# Patient Record
Sex: Female | Born: 1950 | Hispanic: Yes | Smoking: Former smoker
Health system: Southern US, Community
[De-identification: ages and names within clinical notes are randomized; demographics above are authoritative.]

## PROBLEM LIST (undated history)

## (undated) DIAGNOSIS — M1990 Unspecified osteoarthritis, unspecified site: Secondary | ICD-10-CM

## (undated) DIAGNOSIS — M259 Joint disorder, unspecified: Secondary | ICD-10-CM

## (undated) DIAGNOSIS — E785 Hyperlipidemia, unspecified: Secondary | ICD-10-CM

## (undated) DIAGNOSIS — R55 Syncope and collapse: Secondary | ICD-10-CM

## (undated) DIAGNOSIS — R918 Other nonspecific abnormal finding of lung field: Secondary | ICD-10-CM

## (undated) DIAGNOSIS — R42 Dizziness and giddiness: Secondary | ICD-10-CM

## (undated) DIAGNOSIS — I10 Essential (primary) hypertension: Secondary | ICD-10-CM

## (undated) HISTORY — DX: Syncope and collapse: R55

## (undated) HISTORY — PX: KNEE SURGERY: SHX244

## (undated) HISTORY — PX: BUNIONECTOMY: SHX129

## (undated) HISTORY — PX: CARPAL TUNNEL RELEASE: SHX101

## (undated) HISTORY — DX: Joint disorder, unspecified: M25.9

## (undated) HISTORY — PX: BREAST EXCISIONAL BIOPSY: SUR124

## (undated) HISTORY — PX: HEMORRHOID SURGERY: SHX153

## (undated) HISTORY — PX: BREAST SURGERY: SHX581

## (undated) HISTORY — DX: Other nonspecific abnormal finding of lung field: R91.8

## (undated) HISTORY — DX: Dizziness and giddiness: R42

---

## 2016-07-30 ENCOUNTER — Other Ambulatory Visit: Payer: Self-pay | Admitting: Family

## 2016-07-30 DIAGNOSIS — Z122 Encounter for screening for malignant neoplasm of respiratory organs: Secondary | ICD-10-CM

## 2016-08-02 ENCOUNTER — Other Ambulatory Visit: Payer: Self-pay | Admitting: Family

## 2016-08-02 ENCOUNTER — Ambulatory Visit: Payer: Self-pay

## 2016-08-02 ENCOUNTER — Ambulatory Visit
Admission: RE | Admit: 2016-08-02 | Discharge: 2016-08-02 | Disposition: A | Discharge status: Home / Self Care | Payer: Self-pay | Source: Ambulatory Visit | Attending: Family | Admitting: Family

## 2016-08-02 DIAGNOSIS — Z122 Encounter for screening for malignant neoplasm of respiratory organs: Secondary | ICD-10-CM

## 2016-08-02 DIAGNOSIS — Z87891 Personal history of nicotine dependence: Secondary | ICD-10-CM

## 2016-11-05 ENCOUNTER — Other Ambulatory Visit: Payer: Self-pay | Admitting: Family

## 2016-11-05 DIAGNOSIS — R918 Other nonspecific abnormal finding of lung field: Secondary | ICD-10-CM

## 2017-01-11 ENCOUNTER — Ambulatory Visit
Admission: RE | Admit: 2017-01-11 | Discharge: 2017-01-11 | Disposition: A | Discharge status: Home / Self Care | Payer: Medicare Other | Source: Ambulatory Visit | Attending: Family | Admitting: Family

## 2017-01-11 DIAGNOSIS — R918 Other nonspecific abnormal finding of lung field: Secondary | ICD-10-CM

## 2017-10-29 ENCOUNTER — Encounter (HOSPITAL_COMMUNITY): Payer: Self-pay | Admitting: Internal Medicine

## 2017-10-29 ENCOUNTER — Emergency Department (HOSPITAL_COMMUNITY): Payer: Medicare Other

## 2017-10-29 ENCOUNTER — Emergency Department (HOSPITAL_COMMUNITY)
Admission: EM | Admit: 2017-10-29 | Discharge: 2017-10-29 | Disposition: A | Discharge status: Home / Self Care | Payer: Medicare Other | Attending: Emergency Medicine | Admitting: Emergency Medicine

## 2017-10-29 DIAGNOSIS — Y990 Civilian activity done for income or pay: Secondary | ICD-10-CM | POA: Insufficient documentation

## 2017-10-29 DIAGNOSIS — I629 Nontraumatic intracranial hemorrhage, unspecified: Secondary | ICD-10-CM

## 2017-10-29 DIAGNOSIS — I10 Essential (primary) hypertension: Secondary | ICD-10-CM | POA: Diagnosis not present

## 2017-10-29 DIAGNOSIS — S06360A Traumatic hemorrhage of cerebrum, unspecified, without loss of consciousness, initial encounter: Secondary | ICD-10-CM | POA: Diagnosis not present

## 2017-10-29 DIAGNOSIS — E860 Dehydration: Secondary | ICD-10-CM

## 2017-10-29 DIAGNOSIS — W010XXA Fall on same level from slipping, tripping and stumbling without subsequent striking against object, initial encounter: Secondary | ICD-10-CM | POA: Insufficient documentation

## 2017-10-29 DIAGNOSIS — R55 Syncope and collapse: Secondary | ICD-10-CM | POA: Diagnosis not present

## 2017-10-29 DIAGNOSIS — Y92512 Supermarket, store or market as the place of occurrence of the external cause: Secondary | ICD-10-CM | POA: Insufficient documentation

## 2017-10-29 DIAGNOSIS — Y9389 Activity, other specified: Secondary | ICD-10-CM | POA: Insufficient documentation

## 2017-10-29 DIAGNOSIS — S0990XA Unspecified injury of head, initial encounter: Secondary | ICD-10-CM

## 2017-10-29 HISTORY — DX: Essential (primary) hypertension: I10

## 2017-10-29 HISTORY — DX: Hyperlipidemia, unspecified: E78.5

## 2017-10-29 HISTORY — DX: Unspecified osteoarthritis, unspecified site: M19.90

## 2017-10-29 LAB — BASIC METABOLIC PANEL
Anion gap: 11 (ref 5–15)
BUN: 15 mg/dL (ref 8–23)
CHLORIDE: 104 mmol/L (ref 98–111)
CO2: 22 mmol/L (ref 22–32)
CREATININE: 1.15 mg/dL — AB (ref 0.44–1.00)
Calcium: 9.2 mg/dL (ref 8.9–10.3)
GFR calc Af Amer: 55 mL/min — ABNORMAL LOW (ref >60)
GFR, EST NON AFRICAN AMERICAN: 48 mL/min — AB (ref >60)
GLUCOSE: 107 mg/dL — AB (ref 70–99)
Potassium: 3.5 mmol/L (ref 3.5–5.1)
SODIUM: 137 mmol/L (ref 135–145)

## 2017-10-29 LAB — CBC WITH DIFFERENTIAL/PLATELET
Abs Immature Granulocytes: 0 10*3/uL (ref 0.0–0.1)
Basophils Absolute: 0 10*3/uL (ref 0.0–0.1)
Basophils Relative: 0 %
EOS ABS: 0.1 10*3/uL (ref 0.0–0.7)
EOS PCT: 1 %
HEMATOCRIT: 38.8 % (ref 36.0–46.0)
Hemoglobin: 12.9 g/dL (ref 12.0–15.0)
Immature Granulocytes: 0 %
LYMPHS ABS: 1.4 10*3/uL (ref 0.7–4.0)
Lymphocytes Relative: 12 %
MCH: 30.1 pg (ref 26.0–34.0)
MCHC: 33.2 g/dL (ref 30.0–36.0)
MCV: 90.7 fL (ref 78.0–100.0)
MONO ABS: 0.8 10*3/uL (ref 0.1–1.0)
Monocytes Relative: 7 %
Neutro Abs: 9.3 10*3/uL — ABNORMAL HIGH (ref 1.7–7.7)
Neutrophils Relative %: 80 %
Platelets: 227 10*3/uL (ref 150–400)
RBC: 4.28 MIL/uL (ref 3.87–5.11)
RDW: 12.8 % (ref 11.5–15.5)
WBC: 11.6 10*3/uL — AB (ref 4.0–10.5)

## 2017-10-29 LAB — I-STAT TROPONIN, ED: Troponin i, poc: 0 ng/mL (ref 0.00–0.08)

## 2017-10-29 MED ORDER — SODIUM CHLORIDE 0.9 % IV BOLUS
1000.0000 mL | Freq: Once | INTRAVENOUS | Status: AC
  Administered 2017-10-29: 1000 mL via INTRAVENOUS

## 2017-10-29 NOTE — ED Notes (Signed)
Patient transported to CT 

## 2017-10-29 NOTE — Discharge Instructions (Signed)
As we discussed, your head CT today showed a small area of bleeding.  This area is very small and minimal.  He can be safely discharged home.  Please do not take any of your baby aspirin.  You will need to follow-up with referred neurosurgery office for evaluation on an outpatient basis.  Please call their office to arrange for an appointment for reevaluation.  Make sure you are staying hydrated and drinking plenty of fluids.  Follow-up with your primary care doctor as directed.  Return the emergency department for any vision changes, chest pain, difficulty breathing, numbness/weakness of your arms or legs, difficulty walking, vomiting or any other worsening or concerning symptoms.

## 2017-10-29 NOTE — ED Triage Notes (Addendum)
Pt arrived to ED via [facility] after fall and syncopal episode this afternoon. Pt was at work when she became dizzy, then passed out and fell to the ground. 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 Denies head, neck, and back pain. Small abrasion to upper lip noted. Pt states "it all happened so fast. I woke up and I was on the ground."  Vital signs stable. Call bell within reach. Bed locked in lowest position.

## 2017-10-29 NOTE — ED Provider Notes (Signed)
[facility] Provider Note   CSN: [id number] Arrival date & time: 10/29/17  1600     History   Chief Complaint Chief Complaint  Patient presents with  . Loss of Consciousness  . Fall    HPI Belinda Miles is a 67 y.o. female past medical history of arthritis, hyperlipidemia, hypertension who presents via EMS for evaluation of syncopal episode that occurred just prior to ED arrival.  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  She did have positive LOC.  Patient reports that she did not have any preceding chest pain.  Patient states that the next thing she remembers is waking up on the floor with people around her.  Patient reports that the only thing she ate this morning was coffee.  She had been working outside at the garden center for 4 hours prior to onset of syncopal episode.  Patient states she feels a little lightheaded now but otherwise denies any symptoms.  Patient denies any fevers, vision changes, chest pain, difficulty breathing, abdominal pain, numbness/weakness of her arms or legs, nausea/vomiting, neck pain, back pain.   The history is provided by the patient.    Past Medical History:  Diagnosis Date  . Arthritis   . Hyperlipidemia   . Hypertension     There are no active problems to display for this patient.    The histories are not reviewed yet. Please review them in the "History" navigator section and refresh this SmartLink.   OB History   None      Home Medications    Prior to Admission medications   Not on File    Family History No family history on file.  Social History Social History   Tobacco Use  . Smoking status: Not on file  Substance Use Topics  . Alcohol use: Not on file  . Drug use:  Not on file     Allergies   Patient has no allergy information on record.   Review of Systems Review of Systems  Constitutional: Negative for fever.  Respiratory: Negative for cough and shortness of breath.   Cardiovascular: Negative for chest pain.  Gastrointestinal: Negative for abdominal pain, nausea and vomiting.  Genitourinary: Negative for dysuria and hematuria.  Skin: Positive for wound.  Neurological: Positive for syncope and light-headedness. Negative for headaches.  All other systems reviewed and are negative.    Physical Exam Updated Vital Signs BP 123/77   Pulse 74   Temp 98.8 F (37.1 C) (Oral)   Resp 18   Ht 5\' 5"  (1.651 m)   Wt 104.3 kg (230 lb)   SpO2 96%   BMI 38.27 kg/m   Physical Exam  Constitutional: She is oriented to person, place, and time. She appears well-developed and well-nourished.  HENT:  Head: Normocephalic and atraumatic.  Mouth/Throat: Oropharynx is clear and moist and mucous membranes are normal.  Abrasion noted to left upper lip.  No evidence of laceration.  Patient appears intact.  Elevation/depression lateral movement of intermittent mandible intact without any difficulty.  No tenderness palpation noted to mandible, psychotic arch.  Eyes: Pupils are equal, round, and reactive to light. Conjunctivae, EOM and lids are normal.  EOMs intact without any difficulty.  No tenderness palpation noted bilateral  periorbital region.  Neck: Full passive range of motion without pain.  Full flexion/extension and lateral movement of neck fully intact. No bony midline tenderness. No deformities or crepitus.   Cardiovascular: Normal rate, regular rhythm, normal heart sounds and normal pulses. Exam reveals no gallop and no friction rub.  No murmur heard. Pulses:      Radial pulses are 2+ on the right side, and 2+ on the left side.       Dorsalis pedis pulses are 2+ on the right side, and 2+ on the left side.  Pulmonary/Chest: Effort normal and breath  sounds normal.  Abdominal: Soft. Normal appearance. There is no tenderness. There is no rigidity and no guarding.  Musculoskeletal: Normal range of motion.       Thoracic back: She exhibits no tenderness.       Lumbar back: She exhibits no tenderness.  Neurological: She is alert and oriented to person, place, and time.  Cranial nerves III-XII intact Follows commands, Moves all extremities  5/5 strength to BUE and BLE  Sensation intact throughout all major nerve distributions Normal finger to nose. No dysdiadochokinesia. No pronator drift. No gait abnormalities  No slurred speech. No facial droop.   Skin: Skin is warm and dry. Capillary refill takes less than 2 seconds.  Psychiatric: She has a normal mood and affect. Her speech is normal.  Nursing note and vitals reviewed.    ED Treatments / Results  Labs (all labs ordered are listed, but only abnormal results are displayed) Labs Reviewed  BASIC METABOLIC PANEL - Abnormal; Notable for the following components:      Result Value   Glucose, Bld 107 (*)    Creatinine, Ser 1.15 (*)    GFR calc non Af Amer 48 (*)    GFR calc Af Amer 55 (*)    All other components within normal limits  CBC WITH DIFFERENTIAL/PLATELET - Abnormal; Notable for the following components:   WBC 11.6 (*)    Neutro Abs 9.3 (*)    All other components within normal limits  I-STAT TROPONIN, ED    EKG EKG Interpretation  Date/Time:  Tuesday October 29 2017 16:04:53 EDT Ventricular Rate:  79 PR Interval:    QRS Duration: 99 QT Interval:  426 QTC Calculation: 489 R Axis:   46 Text Interpretation:  Sinus rhythm Atrial premature complexes Probable anteroseptal infarct, old Borderline repolarization abnormality No old tracing to compare Confirmed by Azalia Bilis ([id number]) on 10/29/2017 6:15:34 PM   Radiology Ct Head Wo Contrast  Result Date: 10/29/2017 CLINICAL DATA:  Fall after syncopal episode when patient became dizzy passed out and fell to ground.  Initial encounter. EXAM: CT HEAD WITHOUT CONTRAST TECHNIQUE: Contiguous axial images were obtained from the base of the skull through the vertex without intravenous contrast. COMPARISON:  None. FINDINGS: Brain: 6 mm focal hemorrhage posterior right frontal lobe. No other intracranial hemorrhage noted. This probably is related to patient's recent fall. Bleed into primary brain lesion such as cavernoma secondary less likely consideration. This can be assessed on follow-up as hemorrhage clears. No CT evidence of large acute infarct. Vascular: Calcification vertebral arteries and carotid arteries. Skull: No skull fracture detected. Sinuses/Orbits: No acute orbital abnormality. Minimal mucosal thickening right posterior ethmoid sinus air cell. Other: Mastoid air cells and middle ear cavities are clear. IMPRESSION: 6 mm hemorrhagic focus posterior right frontal lobe probably related to patient's recent fall. Close follow-up until this clears to exclude underlying lesion recommended. These results were called by telephone  at the time of interpretation on 10/29/2017 at 5:43 pm to [facility] emergency room physician's assistant, who verbally acknowledged these results. Electronically Signed   By: Lacy Duverney M.D.   On: 10/29/2017 17:48    Procedures Procedures (including critical care time)  Medications Ordered in ED Medications  sodium chloride 0.9 % bolus 1,000 mL (0 mLs Intravenous Stopped 10/29/17 1847)     Initial Impression / Assessment and Plan / ED Course  I have reviewed the triage vital signs and the nursing notes.  Pertinent labs & imaging results that were available during my care of the patient were reviewed by me and considered in my medical decision making (see chart for details).     67 y.o. female who presents for evaluation of syncopal episode that occurred today.  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  Did have positive LOC.   No preceding chest pain. Patient is afebrile, non-toxic appearing, sitting comfortably on examination table. Vital signs reviewed and stable.  Neuro deficits noted on exam.  Consider dehydration versus orthostatic hypotension.  Plan to check orthostatic vital signs, basic labs.  Will give IVF.  CBC with slight leukocytosis of 11.6.  Hemoglobin hematocrit stable.  BMP shows creatinine of 1.15.  Patient receiving IV fluids.  Troponin unremarkable.  CT shows a 6 mm focal hemorrhage posterior right frontal lobe.  No other intracranial hemorrhage noted.  This most likely secondary to the fall.  Patient syncope was from dehydration as she had been outside for 4 hours and had not been staying hydrated.   Patient is on baby aspirin but no other blood thinners.  She is hemodynamically stable here in the ED and without any signs of neuro deficits.  Given minimal bleeding, feel that this can be treated outpatient with neurosurgery follow-up. Discussed patient with Dr. Patria Mane, who is agreeable.   Discussed results with patient.  Patient denies any symptoms at this time.  She is gotten IV fluids and vital signs are stable. She is ambulating in the department without any difficulty.  I discussed with her regarding her head CT.  We will plan to give outpatient neurosurgery follow-up.  Instructed patient not to take her baby aspirin.  Discussed with patient regarding strict head injury precautions. Patient had ample opportunity for questions and discussion. All patient's questions were answered with full understanding. Strict return precautions discussed. Patient expresses understanding and agreement to plan.   Final Clinical Impressions(s) / ED Diagnoses   Final diagnoses:  Syncope, unspecified syncope type  Dehydration  Injury of head, initial encounter  Intracranial hemorrhage [facility])    ED Discharge Orders    None       Rosana Hoes 10/30/17 Georgia Lopes, MD 10/30/17 [phone]

## 2018-12-09 ENCOUNTER — Telehealth: Payer: Self-pay | Admitting: Diagnostic Neuroimaging

## 2018-12-09 NOTE — Telephone Encounter (Signed)
I have called this patient regarding rescheduling their 12/30/18 appointment due to Dr. Leta Baptist being out of office this day. Requested patient call back to reschedule.

## 2018-12-16 ENCOUNTER — Encounter: Payer: Self-pay | Admitting: *Deleted

## 2018-12-17 ENCOUNTER — Encounter: Payer: Self-pay | Admitting: Neurology

## 2018-12-17 ENCOUNTER — Ambulatory Visit (INDEPENDENT_AMBULATORY_CARE_PROVIDER_SITE_OTHER): Payer: Medicare Other | Admitting: Neurology

## 2018-12-17 ENCOUNTER — Other Ambulatory Visit: Payer: Self-pay

## 2018-12-17 ENCOUNTER — Telehealth: Payer: Self-pay | Admitting: *Deleted

## 2018-12-17 VITALS — BP 136/80 | HR 64 | Temp 97.7°F | Ht 65.0 in | Wt 247.0 lb

## 2018-12-17 DIAGNOSIS — R42 Dizziness and giddiness: Secondary | ICD-10-CM | POA: Diagnosis not present

## 2018-12-17 DIAGNOSIS — Q283 Other malformations of cerebral vessels: Secondary | ICD-10-CM | POA: Diagnosis not present

## 2018-12-17 DIAGNOSIS — R2689 Other abnormalities of gait and mobility: Secondary | ICD-10-CM | POA: Diagnosis not present

## 2018-12-17 NOTE — Patient Instructions (Signed)
Angioma cavernoso en los adultos Cavernous Malformation, Adult Un angioma cavernoso es una formacin anormal de vasos sanguneos que tienen mayores probabilidades que los vasos normales de provocar sangrado (hemorragia) y causar otros problemas. En la International Business Machinesmayora de los casos, el angioma cavernoso aparece dentro o debajo de la piel, pero tambin puede formarse en los rganos internos. Puede ser muy peligroso si se forma en el cerebro o en la mdula espinal. La mayora de los angiomas cavernosos estn presentes desde el nacimiento (son congnitos). Cules son las causas? Esta afeccin puede deberse a la presencia de un gen que se transmite de padres a hijos (hereditario). En algunos casos, se desconoce la causa. Qu incrementa el riesgo? Los antecedentes familiares de angioma cavernoso son el nico factor de riesgo conocido. Cules son los signos o los sntomas? Los sntomas de esta afeccin incluyen lo siguiente:  Dolor de Turk[place]istancabeza.  Nuseas o vmitos.  Mareos.  Cambios en la visin o en la audicin.  Convulsiones.  Debilidad o prdida de movilidad en una parte del cuerpo.  Hormigueo o entumecimiento en una parte del cuerpo.  Problemas para hablar.  Torpeza.  Confusin o prdida de memoria. Los sntomas por lo general se presentan solo si resultan afectadas zonas especficas del cerebro o de la mdula espinal. Cmo se diagnostica? Esta afeccin se diagnostica mediante pruebas de diagnstico por imgenes, por ejemplo, una resonancia magntica (RM) o una exploracin por tomografa computarizada (TC). Cmo se trata? El tratamiento de esta afeccin depende de la presencia de los sntomas. Si no tiene sntomas o el angioma no ha provocado sangrado, posiblemente solo necesite hacerse resonancias magnticas con regularidad para controlar el angioma. Tambin puede recibir United Parcelmedicamentos para tratar convulsiones o dolores de Turkmenistancabeza. Si tiene sntomas o el angioma ha provocado sangrado,  Pension scheme managernecesitar someterse a una ciruga para extirpar el angioma cavernoso (craneotoma). En zonas donde no es Programmer, applicationsseguro operar, puede utilizarse un haz concentrado de radiacin (bistur de rayos gamma, o rayos ?) para reducir el tamao del angioma cavernoso. Siga estas indicaciones en su casa:  Infrmese tanto como sea posible acerca de esta afeccin y colabore con el equipo de mdicos.  Tome los medicamentos de venta libre y los recetados solamente como se lo haya indicado el mdico. No tome anticoagulantes, como aspirina, ibuprofeno, antiinflamatorios no esteroides (AINE) ni warfarina, a menos que el mdico se lo haya indicado.  Concurra a todas las visitas de control como se lo haya indicado el mdico. Esto es importante. Comunquese con un mdico si:  Los sntomas no mejoran con Scientist, research (medical)el tratamiento.  Presenta nuevos sntomas.  Siente un dolor de cabeza intenso que no se [place].  Los sntomas vuelven a Research officer, trade unionaparecer. Solicite ayuda de inmediato si:  Siente sbitamente un dolor de cabeza intenso que no tiene causa aparente.  Tiene nuseas o vmitos con otro sntoma.  Siente debilidad o adormecimiento sbito de la cara, el brazo o la pierna, especialmente en un lado del cuerpo.  Tiene una dificultad repentina para caminar o para mover los brazos o las piernas.  Se siente repentinamente confundido.  De repente, tiene dificultad para hablar, comprender o ambas (afasia).  Presenta, sbitamente, dificultad para ver de uno o ambos ojos.  Sbitamente, pierde el equilibrio o la coordinacin.  Presenta rigidez en el cuello.  Tiene dificultad para respirar.  Sufre la prdida parcial o total de la conciencia. Estos sntomas pueden representar un problema grave que constituye Radio broadcast assistantuna emergencia. No espere hasta que los sntomas desaparezcan. Solicite atencin mdica de inmediato. Comunquese con  el servicio de emergencias de su localidad (911 en los [place]). No conduzca por sus propios medios [employer]. Esta informacin no tiene Marine scientist el consejo del mdico. Asegrese de hacerle al mdico cualquier pregunta que tenga. Document Released: 03/08/2011 Document Revised: 06/18/2016 Document Reviewed: 07/29/2015 Elsevier Patient Education  2020 [place].     Cavernous Malformation, Adult A cavernous malformation is an abnormal formation of blood vessels that are more likely than regular blood vessels to bleed (hemorrhage) and cause other problems. A cavernous malformation most often develops in or beneath the skin, but it can also develop in internal organs. It can be very dangerous if it develops in the brain or the spinal cord. Most cavernous malformations are present since birth (congenital). What are the causes? This condition may be caused by a gene that is passed down from parent to child (inherited). In some cases, the cause is not known. What increases the risk? A family history of cavernous malformation is the only known risk factor. What are the signs or symptoms? Symptoms of this condition include:  Headache.  Nausea or vomiting.  Dizziness.  Vision or hearing changes.  Seizure.  Weakness or loss of movement in a part of the body.  Tingling or numbness in part of the body.  Problems with speech.  Clumsiness.  Confusion or loss of memory. Symptoms usually develop only if specific areas of the brain or spinal cord are affected. How is this diagnosed? This condition is diagnosed with an imaging test, such as MRI or a CT scan. How is this treated? Treatment for this condition depends on whether symptoms are present. If you do not have symptoms or a malformation that has caused bleeding, you may only need to have regular MRIs to watch the malformation over time. You may also get medicines to treat any seizures or headaches. If you have symptoms or a malformation that has caused bleeding, surgery to remove the cavernous malformation (craniotomy)  will be needed. In areas that cannot be safely operated on, a focused beam of radiation (gamma knife) may be used to shrink a cavernous malformation. Follow these instructions at home:  Learn as much as you can about your condition and work closely with your team of health care providers.  Take over-the-counter and prescription medicines only as told by your health care provider. Do not take blood thinners, such as aspirin, ibuprofen, NSAIDs, or warfarin, unless your health care provider tells you to do that.  Keep all follow-up visits as told by your health care provider. This is important. Contact a health care provider if:  Your symptoms do not improve with treatment.  You develop new symptoms.  You develop a headache that does not go away.  Your symptoms return. Get help right away if:  You have a sudden, severe headache with no known cause.  You have nausea or vomiting occurring with another symptom.  You have sudden weakness or numbness of your face, arm, or leg, especially on one side of your body.  You have sudden trouble walking or difficulty moving your arms or legs.  You have sudden confusion.  You have sudden trouble speaking, understanding, or both (aphasia).  You have sudden trouble seeing in one or both of your eyes.  You have a sudden loss of balance or coordination.  You have a stiff neck.  You have difficulty breathing.  You have a partial or total loss of consciousness. These symptoms may represent a serious  problem that is an emergency. Do not wait to see if the symptoms will go away. Get medical help right away. Call your local emergency services (911 in the [place] Do not drive yourself to the hospital. This information is not intended to replace advice given to you by your health care provider. Make sure you discuss any questions you have with your health care provider. Document Released: 12/09/2001 Document Revised: 03/01/2017 Document Reviewed:  07/29/2015 Elsevier Patient Education  2020 [employer].

## 2018-12-17 NOTE — Telephone Encounter (Signed)
Pt no showed new pt appt today. 

## 2018-12-17 NOTE — Progress Notes (Signed)
[facility]    Provider:  Dr Jaynee Eagles Requesting Provider: Berkley Harvey, NP Primary Care Provider:  Berkley Harvey, NP  CC:  Cavernoma  HPI:  Belinda Miles is a 68 y.o. female here as requested by Berkley Harvey, NP for cavernoma. Per neurosurgery "This dot of blood is doing nothing and she will need absolutely no follow up for this" Dr. Christella Noa.  She has had dizziness, vertigo, this is resolved, sometimes she gets up and feels dizzy, she stands all the time and has to hold herself, she feels unstable, She doesn't drink fluids during the day and she can't drink a lot at job. Not now however, she feels she has no balance, no balance exercises exercises at home, she has not been to physical therapy, She is not symptomatic. She denies any neck or back pain. She denies any numbness in her feet. She has carpal tunnel in the right hand causing numbness, she has had the surgery on the left hand. No falls, no syncopal events in the past year. Last year she was at work, she felt her blood pressure go down and she lost consciousness, no weakness, no headaches.   Reviewed notes, labs and imaging from outside physicians, which showed:   I reviewed Dr. Ronnald Ramp' notes, during visit BMP, CBC, sed rate, TSH, MRI of the brain were ordered.  At that appointment she was talking about dizziness and feeling unstable on her feet.  She had been drinking some energy drinks.  Not like vertigo.  Feels like she is moving in slow motion.  No vision changes just feels funny.  Also some numbness in both hands, feels like they are asleep, but braces to wear at night but not wearing them.  Personally reviewed CT head images and agree with the following: 6 mm hemorrhagic focus posterior right frontal lobe probably related to patient's recent fall.  She was evaluated by [place] [facility] for the cavernoma, showed less than a 5 mm dot of blood in the right frontal area, no headaches, no intervention.   I  reviewed report of MRI of the brain 11/17/2018 which showed a cavernoma in the right frontal convexity with evidence of prior hemorrhage, otherwise normal for age, no acute abnormality.  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  Review of Systems: Patient complains of symptoms per HPI as well as the following symptoms: vertigo. Pertinent negatives and positives per HPI. All others negative.   Social History   Socioeconomic History  . Marital status: Widowed    Spouse name: Not on file  . Number of children: 4  . Years of education: 72  . Highest education level: Not on file  Occupational History  . Not on file  Social Needs  . Financial resource strain: Not on file  . Food insecurity    Worry: Not on file    Inability: Not on file  . Transportation needs    Medical: Not on file    Non-medical: Not on file  Tobacco Use  . Smoking status: Former Smoker    Quit date: 04/02/2010    Years since quitting: 8.7  . Smokeless tobacco: Never Used  Substance and Sexual Activity  . Alcohol use: Yes    Comment: occasional  . Drug use: Never  . Sexual activity: Not on file  Lifestyle  . Physical activity    Days per week: Not on file    Minutes per session: Not on file  . Stress: Not on file  Relationships  . Social Musician on phone: Not on file    Gets together: Not on file    Attends religious service: Not on file    Active member of club or organization: Not on file    Attends meetings of clubs or organizations: Not on file    Relationship status: Not on file  . Intimate partner violence    Fear of current or ex partner: Not on file    Emotionally abused: Not on file    Physically abused: Not on file    Forced sexual activity: Not on file  Other Topics Concern  . Not on file  Social History  Narrative  . Not on file    Family History  Problem Relation Age of Onset  . Early death Mother        "botched surgery"  . Depression Father   . Suicidality Father   . Early death Brother        MVC  . Early death Brother        plane crash  . AVM Brother        bleeding  . AVM Cousin     Past Medical History:  Diagnosis Date  . Arthritis   . Dizziness   . Hyperlipidemia   . Hypertension   . Knee problem   . Pulmonary nodules   . Syncope and collapse     Patient Active Problem List   Diagnosis Date Noted  . 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 Vertigo 12/17/2018    Past Surgical History:  Procedure Laterality Date  . BREAST EXCISIONAL BIOPSY Bilateral    benign cysts at age 30 and 26  . BREAST SURGERY    . BUNIONECTOMY    . CARPAL TUNNEL RELEASE Left   . CESAREAN SECTION    . HEMORRHOID SURGERY    . KNEE SURGERY Bilateral    meniscal repair    Current Outpatient Medications  Medication Sig Dispense Refill  . Acetaminophen (TYLENOL PO) Take by mouth as needed.    Marland Kitchen amLODipine (NORVASC) 10 MG tablet Take 10 mg by mouth daily.    Marland Kitchen atorvastatin (LIPITOR) 10 MG tablet Take 10 mg by mouth daily.    . Cyanocobalamin (VITAMIN B-12 PO) Take by mouth.    . Glucosamine HCl (GLUCOSAMINE PO) Take by mouth.    . meloxicam (MOBIC) 7.5 MG tablet Take 7.5 mg by mouth daily.    . Multiple Vitamin (MULTI-VITAMINS) TABS Take by mouth.    . cetirizine (ZYRTEC) 10 MG tablet Take 10 mg by mouth daily.    . hydrochlorothiazide (HYDRODIURIL) 12.5 MG tablet Take 12.5 mg by mouth daily.     No current facility-administered medications for this visit.     Allergies as of 12/17/2018  . (Not on File)    Vitals: BP 136/80 (BP Location: Right Arm, Patient Position: Sitting)   Pulse 64   Temp 97.7 F (36.5 C) Comment: taken by front staff  Ht 5\' 5"  (1.651 m)   Wt 247 lb (112 kg)   BMI 41.10 kg/m  Last Weight:  Wt Readings from Last 1 Encounters:  12/17/18 247 lb (112 kg)    Last Height:   Ht Readings from Last 1 Encounters:  12/17/18 5\' 5"  (1.651 m)  Physical exam: Exam: Gen: NAD, conversant, well nourised, obese, well groomed                     CV: RRR, no MRG. No Carotid Bruits. No peripheral edema, warm, nontender Eyes: Conjunctivae clear without exudates or hemorrhage  Neuro: Detailed Neurologic Exam  Speech:    Speech is normal; fluent and spontaneous with normal comprehension.  Cognition:    The patient is oriented to person, place, and time;     recent and remote memory intact;     language fluent;     normal attention, concentration,     fund of knowledge Cranial Nerves:    The pupils are equal, round, and reactive to light. The fundi are normal and spontaneous venous pulsations are present. Visual fields are full to finger confrontation. Extraocular movements are intact. Trigeminal sensation is intact and the muscles of mastication are normal. The face is symmetric. The palate elevates in the midline. Hearing intact. Voice is normal. Shoulder shrug is normal. The tongue has normal motion without fasciculations.   Coordination:    Normal finger to nose and heel to shin. Normal rapid alternating movements.   Gait:    Heel-toe and tandem gait with imbalance.   Motor Observation:    No asymmetry, no atrophy, and no involuntary movements noted. Tone:    Normal muscle tone.    Posture:    Posture is normal. normal erect    Strength:    Strength is V/V in the upper and lower limbs.      Sensation: intact to LT     Reflex Exam:  DTR's:    Hypo AJs, can ellicit normal reflexes with dendritic maneuver elsewhere.    Toes:    The toes are downgoing bilaterally.   Clonus:    Clonus is absent.    Assessment/Plan:   68 y.o. female here as requested by Iona HansenJones, Penny L, NP for cavernoma.  Patient with a very small cavernoma.  I do not think that this is causing her any symptoms.  Monitor based on symptoms.  There is risk of bleeding,  discussed the risks of bleeding with patient, usually the bleeding risk is quite small however and usually no treatment is indicated or needed.  - she feels dizzy sometimes when standing, had a syncopal event a year ago nothing since then, Dxed with dehydration vs orthostatic hypotension. She doesn't drink water all day, I discussed she needs to hydrate better, she had recently started a "fluid pill"  - fall precautions, PT for balance and vertigo, also show her epley maneuver for any future vertigionous episodes  -  Per neurosurgery evaluation "This dot of blood is doing nothing and she will need absolutely no follow up for this" Dr. Franky Machoabbell.   Orders Placed This Encounter  Procedures  . Ambulatory referral to Physical Therapy    Cc: Iona HansenJones, Penny L, NP,    A total of 60 minutes was spent face-to-face with this patient. Over half this time was spent on counseling patient on the  1. Cerebral cavernoma   2. Vertigo   3. Imbalance    diagnosis and different diagnostic and therapeutic options, counseling and coordination of care, risks ans benefits of management, compliance, or risk factor reduction and education.     Naomie DeanAntonia Ariyah Sedlack, MD  [facility] [address] [place], [place] [id number]  Phone [phone] Fax [phone]

## 2018-12-24 ENCOUNTER — Ambulatory Visit: Payer: Medicare Other | Admitting: Physical Therapy

## 2018-12-30 ENCOUNTER — Ambulatory Visit: Payer: Medicare Other | Admitting: Physical Therapy

## 2018-12-30 ENCOUNTER — Ambulatory Visit: Payer: Medicare Other | Admitting: Diagnostic Neuroimaging

## 2019-01-27 ENCOUNTER — Telehealth: Payer: Self-pay | Admitting: Neurology

## 2019-01-27 NOTE — Telephone Encounter (Addendum)
General 01/13/2019 1:39 PM Franchot Gallo - -  Note   Left a message to schedule Patient had been scheduled 2 other times and cancelled          Per physical Therapy.

## 2019-02-03 IMAGING — CT CT CHEST W/O CM
3 of 4 series · 16 of 30 positions shown, 18 images · non-contrast
Comparison: CT chest, 08/02/2016

CLINICAL DATA: F/u pulmonary nodules No new complaints No sx No hx
of ca

EXAM:
CT CHEST WITHOUT CONTRAST
TECHNIQUE: Multidetector CT imaging of the chest was performed following the
standard protocol without IV contrast.

[Series 3: chest w/o · axial · non-contrast · 0.78mm/px · z∈[-284,-29]mm · 7 of 137 slices shown, 9 images]
[im 18/137  mediastinal]
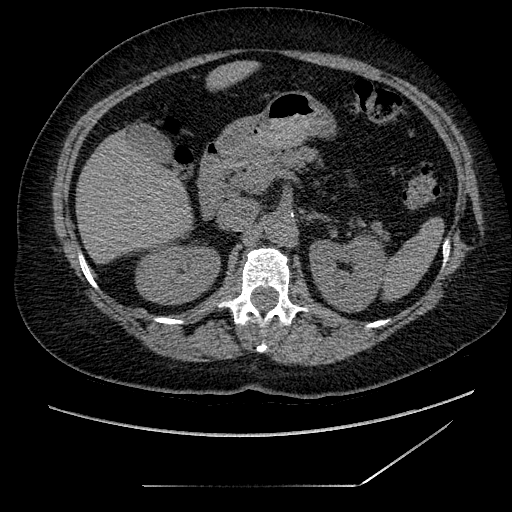
[im 18/137  lung]
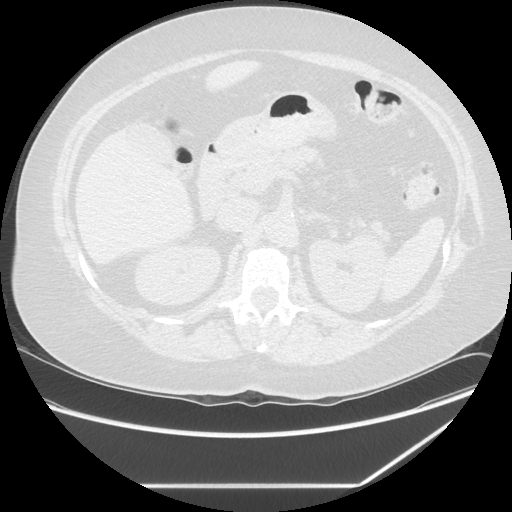
[im 35/137  lung]
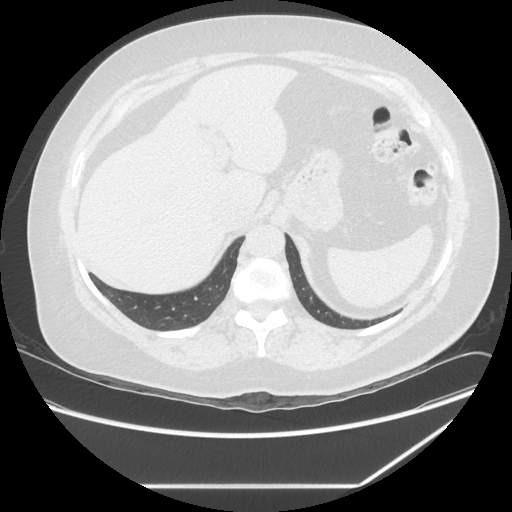
[im 52/137  lung]
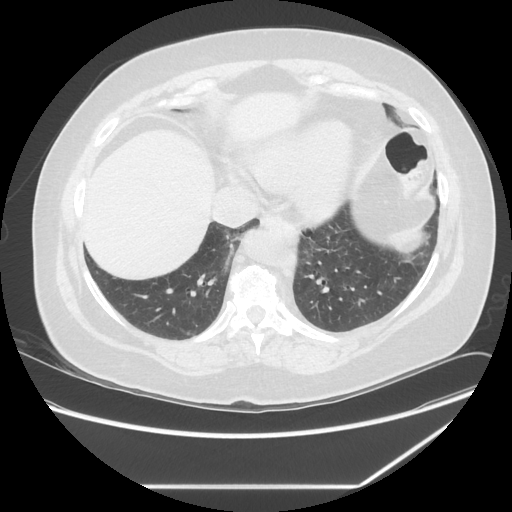
[im 69/137  lung]
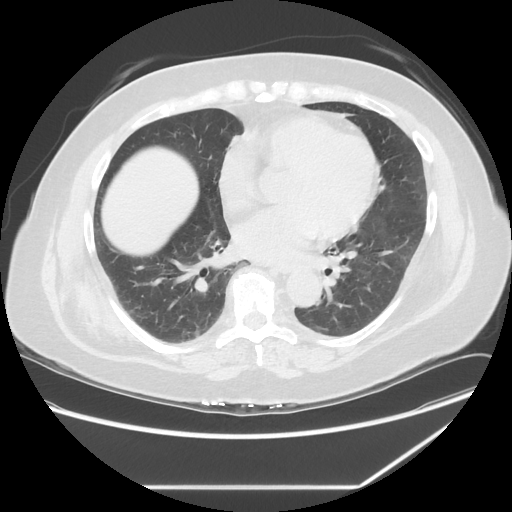
[im 86/137  mediastinal]
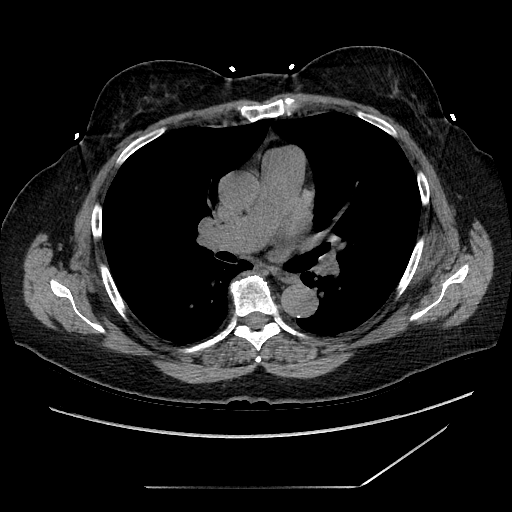
[im 86/137  lung]
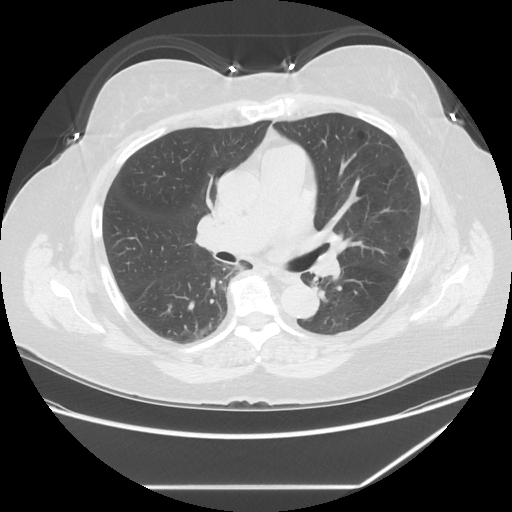
[im 103/137  lung]
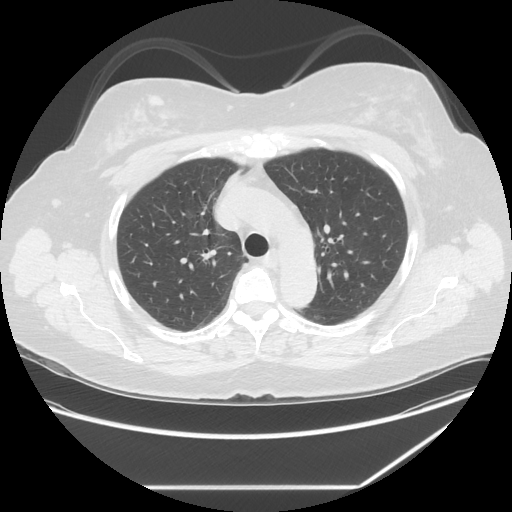
[im 120/137  lung]
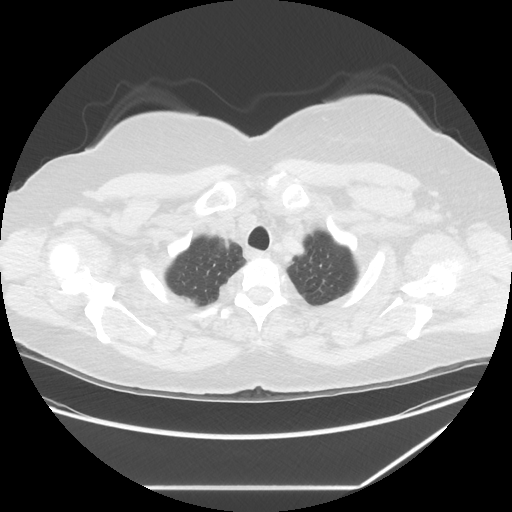

[Series 4: lung windows · axial · 0.78mm/px · z∈[-284,-29]mm · 7 of 137 slices shown]
[im 18/137  lung]
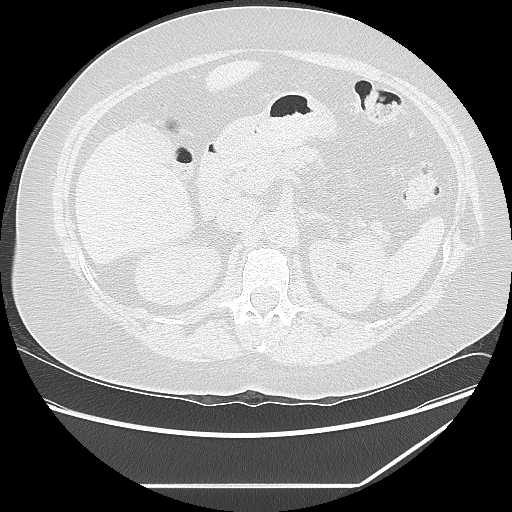
[im 35/137  lung]
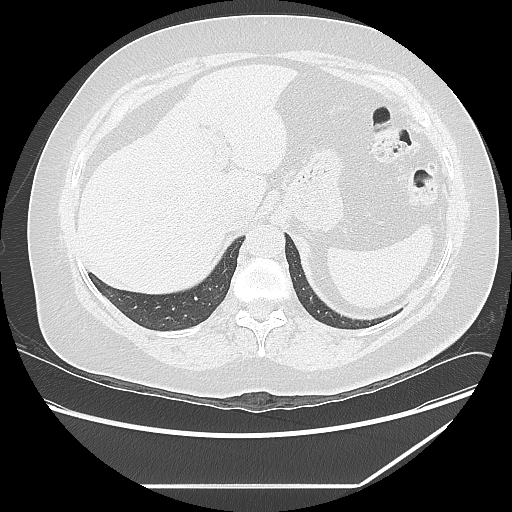
[im 52/137  lung]
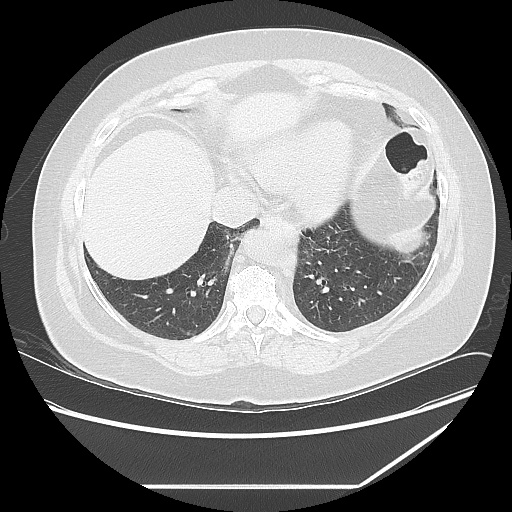
[im 69/137  lung]
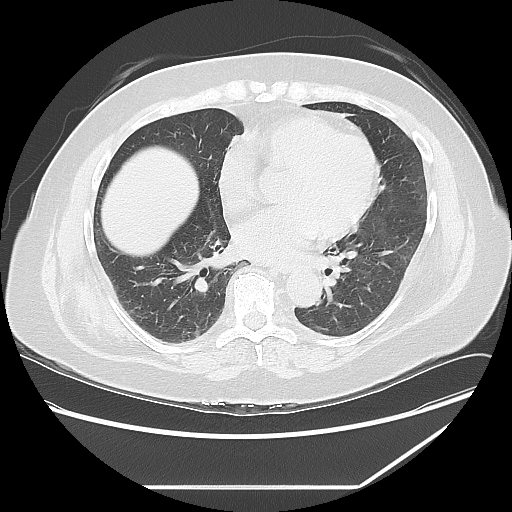
[im 86/137  lung]
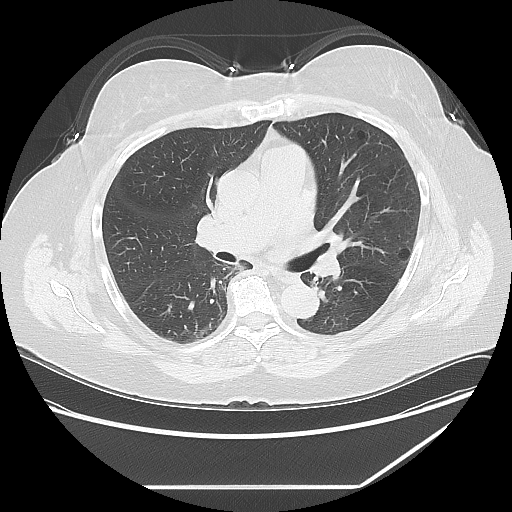
[im 103/137  lung]
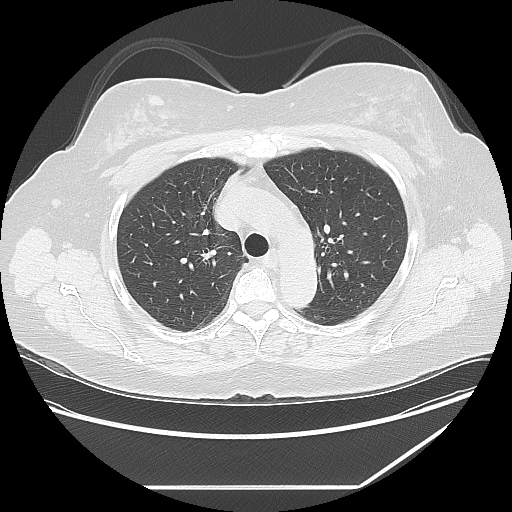
[im 120/137  lung]
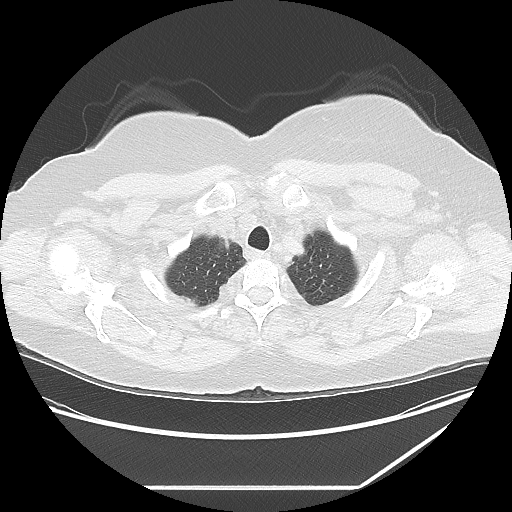

[Series 602: sagittal body · sagittal · 0.78mm/px · 2 of 162 slices shown]
[im 18/162  mediastinal]
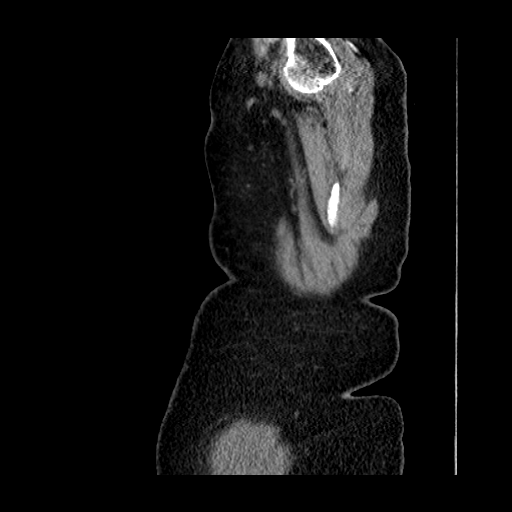
[im 36/162  mediastinal]
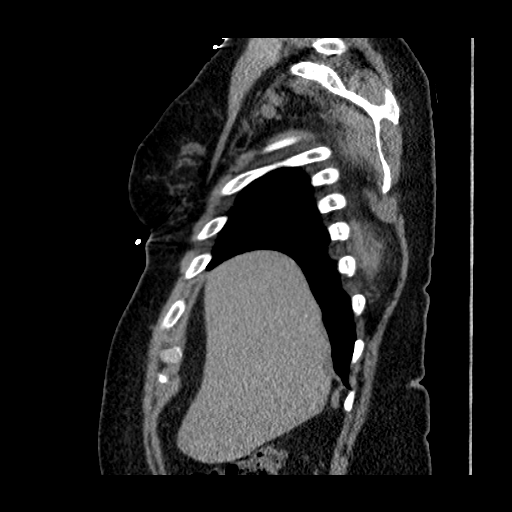

[16 of 30 positions shown; findings below may reference images not displayed]

FINDINGS: Cardiovascular: Heart is normal in size. There are moderate left
coronary artery calcifications. Great vessels normal in caliber.

Mediastinum/Nodes: No neck base or axillary masses or adenopathy.
Visualized thyroid is unremarkable. No mediastinal or hilar masses
or adenopathy. Small hiatal hernia. Esophagus otherwise
unremarkable. Trachea is patent.

Lungs/Pleura: Several small lung nodules. The previously identified
focus of ground-glass opacity in the left upper lobe has resolved.

3-4 mm nodule, right upper lobe, posterior medially, image 38,
series 4.

4 mm pleural-based nodule, right middle lobe, image 65.

2 mm subpleural nodule, posterior right lower lobe, image 66.

Mild atelectasis scarring in the left upper lobe lingula. Mild
scarring or atelectasis in the anteromedial right upper lobe. Minor
subsegmental atelectasis in the lower lobes. This is stable.

No evidence of pneumonia. No pulmonary edema. No pleural effusion or
pneumothorax.

Upper Abdomen: No acute abnormality.

Musculoskeletal: No fracture or acute finding. No osteoblastic or
osteolytic lesions.
IMPRESSION: 1. Resolved small area of ground-glass opacity in the left upper
lobe.
2. Small stable right sided pulmonary nodules, all less than 5 mm,
stable for 5 months. No follow-up needed if patient is low-risk (and
has no known or suspected primary neoplasm). Non-contrast chest CT
can be considered in 12 months if patient is high-risk, to confirm
longer term stability. This recommendation follows the consensus
statement: Guidelines for Management of Incidental Pulmonary Nodules
Detected on CT Images: From the [HOSPITAL] 9838; Radiology
3. No acute findings.

## 2019-06-11 ENCOUNTER — Ambulatory Visit: Payer: Medicare HMO | Attending: Internal Medicine

## 2019-06-11 DIAGNOSIS — Z23 Encounter for immunization: Secondary | ICD-10-CM

## 2019-06-11 NOTE — Progress Notes (Signed)
   Covid-19 Vaccination Clinic  Name:  Belinda Miles    MRN: [id number] DOB: [birth year]  06/11/2019  [name] was observed post Covid-19 immunization for 15 minutes without incident. She was provided with Vaccine Information Sheet and instruction to access the V-Safe system.   [name] was instructed to call 911 with any severe reactions post vaccine: Marland Kitchen Difficulty breathing  . Swelling of face and throat  . A fast heartbeat  . A bad rash all over body  . Dizziness and weakness   Immunizations Administered    Name Date Dose VIS Date Route   Pfizer COVID-19 Vaccine 06/11/2019 11:50 AM 0.3 mL 03/13/2019 Intramuscular   Manufacturer: [employer], [employer]   Lot: [id number]   NDC: [address]-4

## 2019-07-06 ENCOUNTER — Ambulatory Visit: Payer: Medicare HMO

## 2019-07-07 ENCOUNTER — Ambulatory Visit: Payer: Medicare HMO | Attending: Internal Medicine

## 2019-07-07 DIAGNOSIS — Z23 Encounter for immunization: Secondary | ICD-10-CM

## 2019-07-07 NOTE — Progress Notes (Signed)
   Covid-19 Vaccination Clinic  Name:  QUADASIA NEWSHAM    MRN: [id number] DOB: 21-Jul-1950  07/07/2019  Ms. Meggison was observed post Covid-19 immunization for 15 minutes without incident. She was provided with Vaccine Information Sheet and instruction to access the V-Safe system.   Ms. Peyton was instructed to call 911 with any severe reactions post vaccine: Marland Kitchen Difficulty breathing  . Swelling of face and throat  . A fast heartbeat  . A bad rash all over body  . Dizziness and weakness   Immunizations Administered    Name Date Dose VIS Date Route   Pfizer COVID-19 Vaccine 07/07/2019  9:27 AM 0.3 mL 03/13/2019 Intramuscular   Manufacturer: [employer], [employer]   Lot: [id number]   NDC: [address]-3

## 2019-10-19 ENCOUNTER — Ambulatory Visit: Payer: Medicare Other | Admitting: Cardiology

## 2019-11-06 ENCOUNTER — Ambulatory Visit: Payer: Medicare HMO | Admitting: Cardiology
